# Patient Record
Sex: Female | Born: 1950 | Race: White | Hispanic: No | Marital: Married | Smoking: Former smoker
Health system: Southern US, Community
[De-identification: ages and names within clinical notes are randomized; demographics above are authoritative.]

## PROBLEM LIST (undated history)

## (undated) DIAGNOSIS — H409 Unspecified glaucoma: Secondary | ICD-10-CM

## (undated) HISTORY — PX: ORTHOPEDIC SURGERY: SHX850

---

## 2008-03-03 ENCOUNTER — Ambulatory Visit: Payer: Self-pay | Admitting: Family Medicine

## 2008-03-04 ENCOUNTER — Ambulatory Visit: Payer: Self-pay | Admitting: Family Medicine

## 2008-12-28 IMAGING — US US EXTREM LOW VENOUS*L*
1 series · 17 of 24 positions shown · non-contrast
Comparison: none

REASON FOR EXAM: Left leg pain and redness, evaluate for DVT
        Call report to: [phone]
COMMENTS:

[Series 1: us extrem low venous*left* · 17 of 25 slices shown]
[im 1/25]
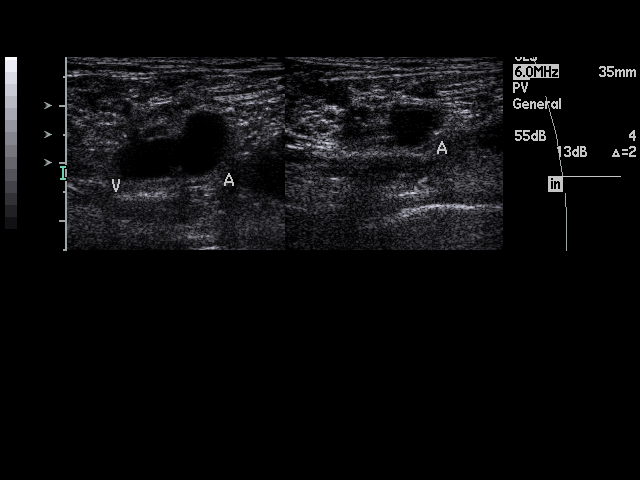
[im 3/25]
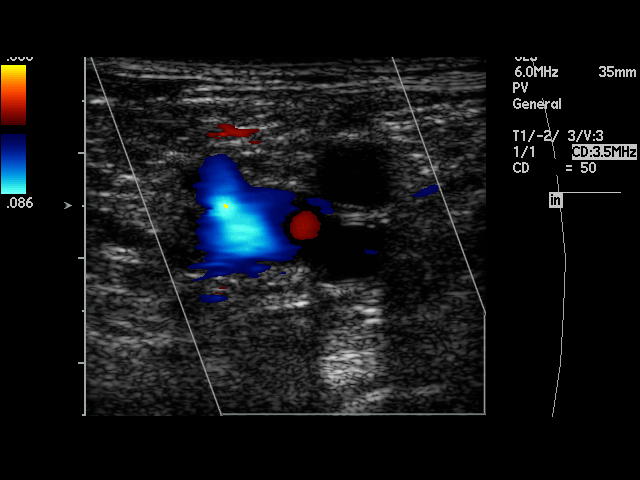
[im 4/25]
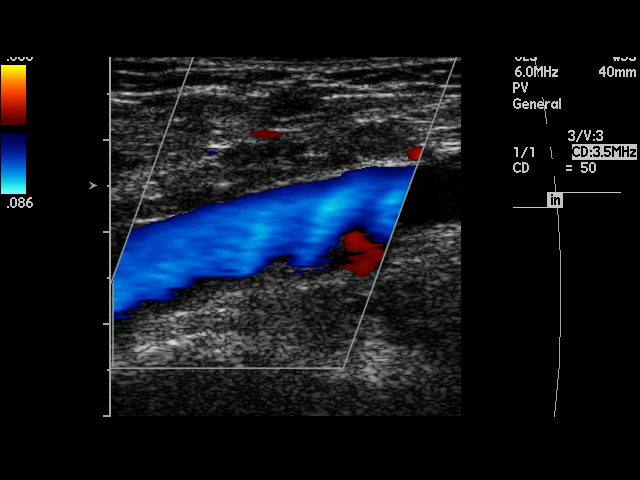
[im 5/25]
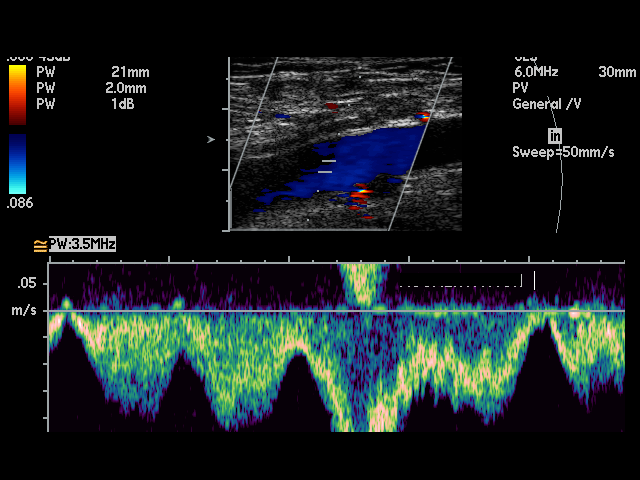
[im 7/25]
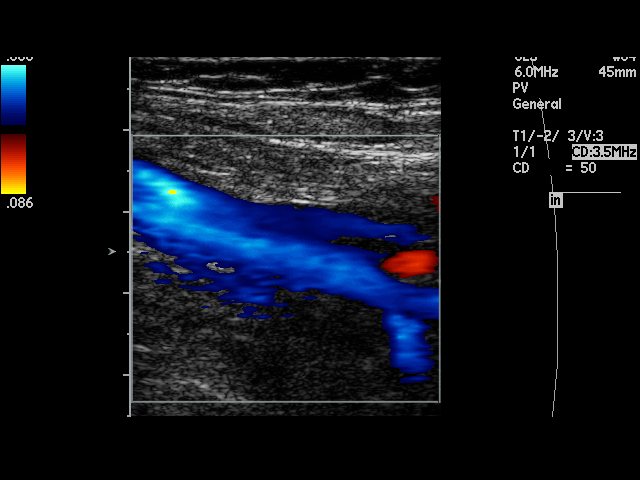
[im 8/25]
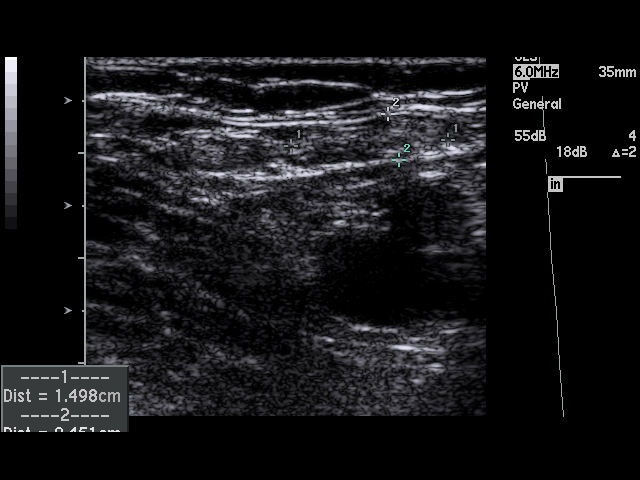
[im 10/25]
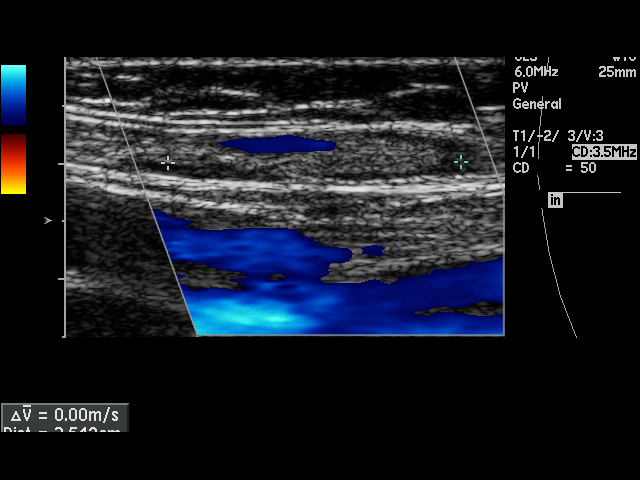
[im 11/25]
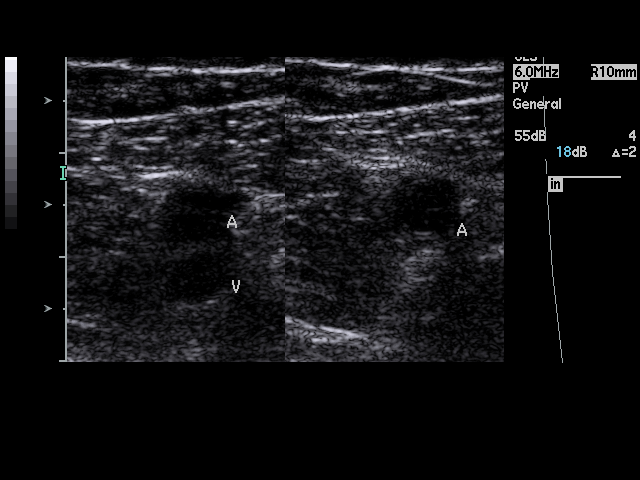
[im 13/25]
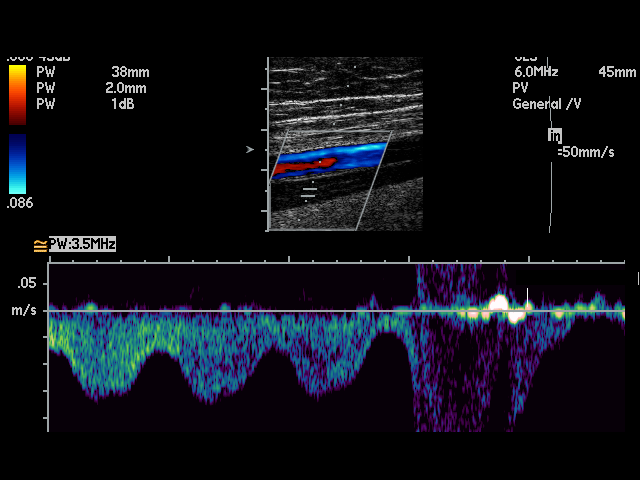
[im 14/25]
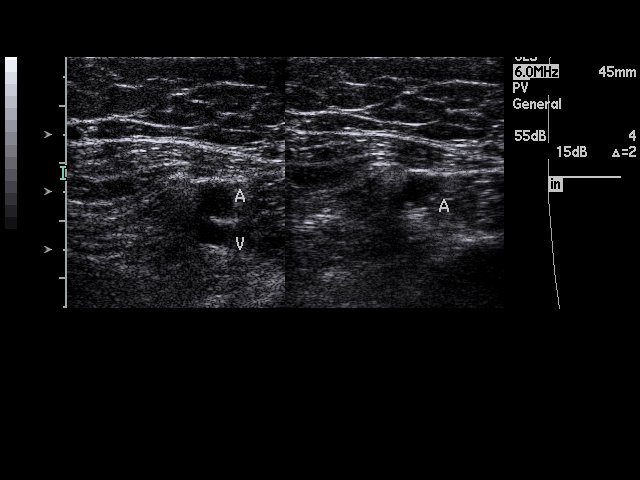
[im 15/25]
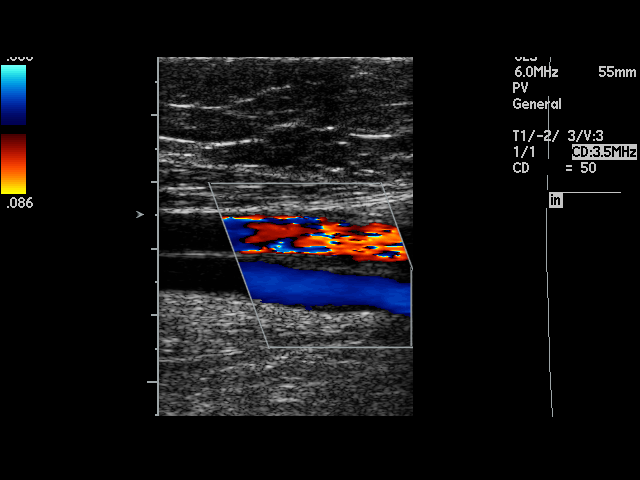
[im 17/25]
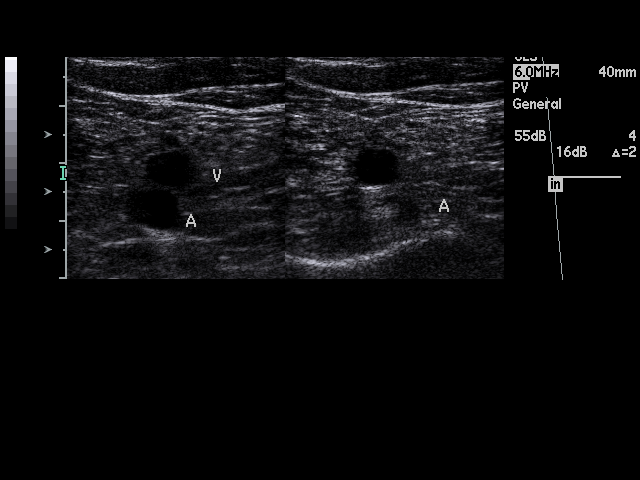
[im 18/25]
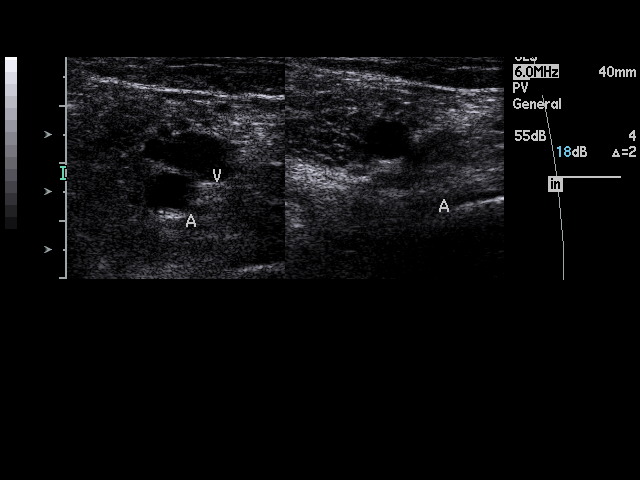
[im 20/25]
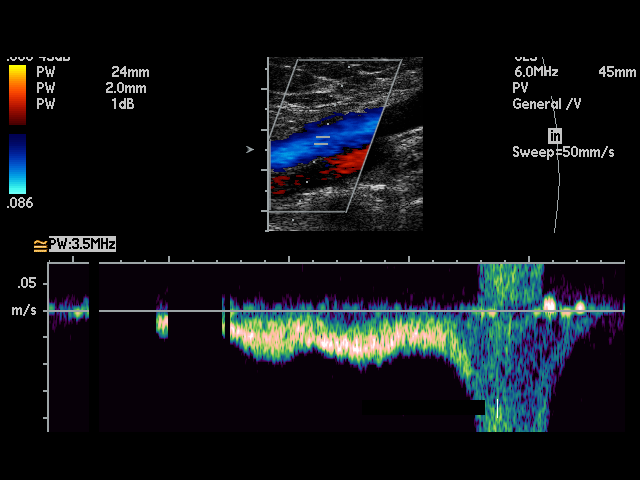
[im 21/25]
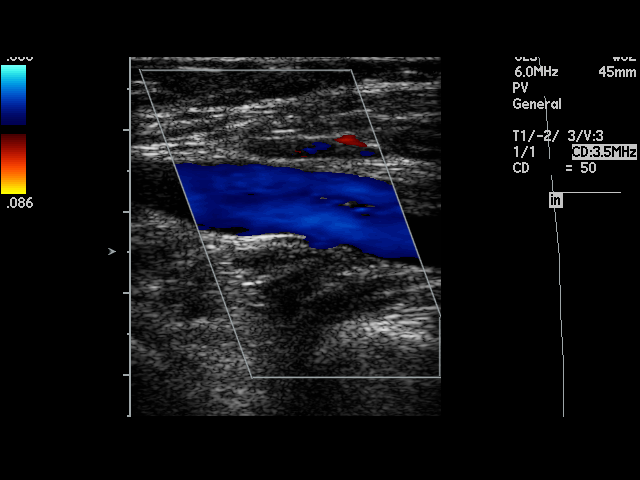
[im 22/25]
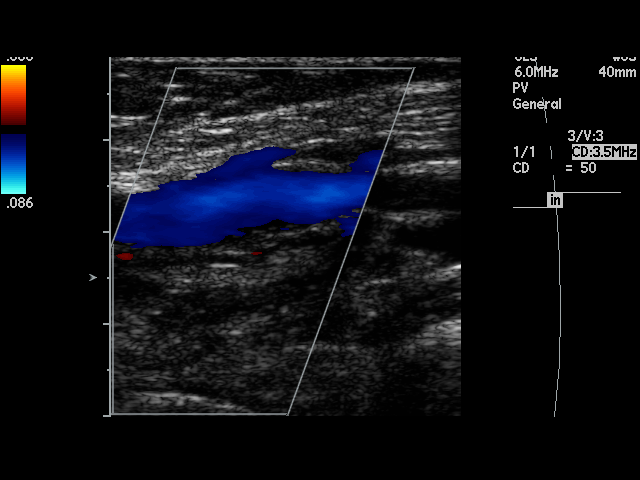
[im 25/25]
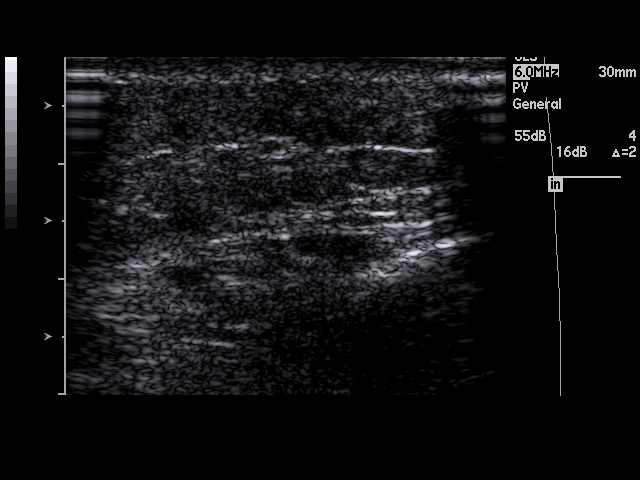

[17 of 24 positions shown; findings below may reference images not displayed]

PROCEDURE:     SKNGHWAN - SKNGHWAN DOPPLER LOW EXTR LEFT  - March 04, 2008  [DATE]

RESULT:     The augmentation and flow waveforms are normal. The LEFT femoral
and popliteal vein shows complete compressibility throughout its course.
Doppler examination shows no occlusion or evidence of deep vein thrombosis.
IMPRESSION: No deep vein thrombosis is identified in the LEFT leg.

## 2013-05-17 ENCOUNTER — Ambulatory Visit: Payer: Self-pay | Admitting: Family Medicine

## 2017-06-18 ENCOUNTER — Ambulatory Visit
Admission: EM | Admit: 2017-06-18 | Discharge: 2017-06-18 | Disposition: A | Discharge status: Home / Self Care | Payer: Medicare Other

## 2017-06-18 DIAGNOSIS — S60862A Insect bite (nonvenomous) of left wrist, initial encounter: Secondary | ICD-10-CM

## 2017-06-18 DIAGNOSIS — L03113 Cellulitis of right upper limb: Secondary | ICD-10-CM | POA: Diagnosis not present

## 2017-06-18 DIAGNOSIS — W57XXXA Bitten or stung by nonvenomous insect and other nonvenomous arthropods, initial encounter: Secondary | ICD-10-CM | POA: Diagnosis not present

## 2017-06-18 DIAGNOSIS — R030 Elevated blood-pressure reading, without diagnosis of hypertension: Secondary | ICD-10-CM | POA: Diagnosis not present

## 2017-06-18 HISTORY — DX: Unspecified glaucoma: H40.9

## 2017-06-18 MED ORDER — MUPIROCIN CALCIUM 2 % EX CREA
1.0000 | TOPICAL_CREAM | Freq: Two times a day (BID) | CUTANEOUS | 0 refills | Status: AC
Start: 2017-06-18 — End: 2017-06-25

## 2017-06-18 MED ORDER — DOXYCYCLINE HYCLATE 100 MG PO CAPS: 100.0000 mg | ORAL_CAPSULE | Freq: Two times a day (BID) | ORAL | 0 refills | Status: AC

## 2017-06-18 NOTE — ED Provider Notes (Signed)
CSN: [id number]     Arrival date & time 06/18/17  1417 History   None    Chief Complaint  Patient presents with  . Insect Bite   (Consider location/radiation/quality/duration/timing/severity/associated sxs/prior Treatment) Pt is right hand dominant, tetanus UTD   The history is provided by the patient. No language interpreter was used.    Past Medical History:  Diagnosis Date  . Glaucoma    Past Surgical History:  Procedure Laterality Date  . ORTHOPEDIC SURGERY     History reviewed. No pertinent family history. Social History  Substance Use Topics  . Smoking status: Former Smoker    Types: Cigarettes    Quit date: 06/18/2016  . Smokeless tobacco: Never Used  . Alcohol use No   OB History    No data available     Review of Systems  Constitutional: Negative for fever.  Skin: Positive for color change and wound.  All other systems reviewed and are negative.   Allergies  Patient has no known allergies.  Home Medications   Prior to Admission medications   Medication Sig Start Date End Date Taking? Authorizing Provider  timolol (BETIMOL) 0.25 % ophthalmic solution 1-2 drops 2 (two) times daily.   Yes [provider]  Travoprost, BAK Free, (TRAVATAN) 0.004 % SOLN ophthalmic solution 1 drop at bedtime.   Yes [provider]  doxycycline (VIBRAMYCIN) 100 MG capsule Take 1 capsule (100 mg total) by mouth 2 (two) times daily. 06/18/17   Amelita Risinger, Para MarchJeanette, NP  mupirocin cream (BACTROBAN) 2 % Apply 1 application topically 2 (two) times daily. 06/18/17 06/25/17  Kiearra Oyervides, Para MarchJeanette, NP   Meds Ordered and Administered this Visit  Medications - No data to display  BP (!) 143/88 (BP Location: Left Arm)   Pulse 78   Temp 98.6 F (37 C) (Oral)   Resp 16   Ht 5\' 3"  (1.6 m)   Wt 163 lb (73.9 kg)   SpO2 99%   BMI 28.87 kg/m  No data found.   Physical Exam  Constitutional: She is oriented to person, place, and time. She appears well-developed and  well-nourished. She is active and cooperative. No distress.  HENT:  Head: Normocephalic.  Eyes: Pupils are equal, round, and reactive to light.  Neck: Normal range of motion.  Cardiovascular: Normal rate, regular rhythm and normal pulses.   Pulses:      Radial pulses are 2+ on the right side, and 2+ on the left side.  Pulmonary/Chest: Effort normal and breath sounds normal.  Musculoskeletal: Normal range of motion. She exhibits no edema or tenderness.  Lymphadenopathy:    She has no cervical adenopathy.  Neurological: She is alert and oriented to person, place, and time. No cranial nerve deficit or sensory deficit. GCS eye subscore is 4. GCS verbal subscore is 5. GCS motor subscore is 6.  Skin: Skin is warm and dry. Rash noted. Rash is not vesicular and not urticarial. There is erythema.  Left inner wrist single area of weeping c/w envenomation of unknown insect. Pt erythema from left inner wrist to mid forearm.   Psychiatric: She has a normal mood and affect. Her speech is normal and behavior is normal.  Nursing note and vitals reviewed.   Urgent Care Course     Procedures (including critical care time)  Labs Review Labs Reviewed - No data to display  Imaging Review No results found.       MDM   1. Insect bite, initial encounter   2. Cellulitis of  right upper extremity   3. Elevated blood pressure reading    Rest,elevate, take meds. Watch for s/s of infection, if worsening redness, fever over 101, N,V, go to Er for further evaluation. Follow up with pcp for recheck of bp in 2-3 days.   Clancy Gourd, NP 06/18/17 1719

## 2017-06-18 NOTE — ED Triage Notes (Signed)
Pt reports being bitten or stung by an insect yesterday. Left forearm swelling, redness and burning. 1/10.

## 2017-06-18 NOTE — Discharge Instructions (Signed)
Rest,elevate, take meds. Watch for s/s of infection, if worsening redness, fever over 101, N,V, go to Er for further evaluation. Follow up with pcp for recheck of bp in 2-3 days.

## 2018-06-18 ENCOUNTER — Ambulatory Visit
Admission: EM | Admit: 2018-06-18 | Discharge: 2018-06-18 | Disposition: A | Discharge status: Home / Self Care | Payer: Medicare Other | Attending: Internal Medicine | Admitting: Internal Medicine

## 2018-06-18 DIAGNOSIS — S50862A Insect bite (nonvenomous) of left forearm, initial encounter: Secondary | ICD-10-CM

## 2018-06-18 DIAGNOSIS — W57XXXA Bitten or stung by nonvenomous insect and other nonvenomous arthropods, initial encounter: Secondary | ICD-10-CM

## 2018-06-18 MED ORDER — CEPHALEXIN 500 MG PO CAPS: 500.0000 mg | ORAL_CAPSULE | Freq: Two times a day (BID) | ORAL | 0 refills | Status: AC

## 2018-06-18 MED ORDER — PREDNISONE 50 MG PO TABS: 50.0000 mg | ORAL_TABLET | Freq: Every day | ORAL | 0 refills | Status: AC

## 2018-06-18 NOTE — Discharge Instructions (Addendum)
Sting or bite on forearm represents a severe local reaction rather than a true allergy.  Prescriptions for prednisone (steroid, to help with swelling/itching) and keflex (antiobiotic, in case of infection) were sent to the pharmacy.  Ice for 5-10 minutes several times daily will also help decrease swelling/itching/discomfort.  Anticipate gradual improvement over the next few days; may take a couple weeks for all swelling/itching to subside.

## 2018-06-18 NOTE — ED Triage Notes (Addendum)
Pt states she was bit yesterday on her left forearm, unsure what it was. Does not itch but it is red and swollen. Did apply hydrocortisone to it. States it was some sore of insect that got her. Does have streaking going up her arm and warm to the touch.

## 2018-06-19 NOTE — ED Provider Notes (Signed)
[facility]    CSN: [id number] Arrival date & time: 06/18/18  1754     History   Chief Complaint Chief Complaint  Patient presents with  . Insect Bite    HPI Rebecca Dorsey is a 67 y.o. female.   She was stung on the left forearm yesterday by type of insect, immediately had a red slightly swollen lesion around the bite site, a couple of inches across.  Today, the site has drastically expanded, with redness and induration over much of the ventral surface of her left forearm.  There is also a streak at the proximal aspect of this extending about 4 inches towards her elbow.  Itchy, does not really hurt.  No fever, no malaise.  Not coughing, no breathlessness, no GI upset.  Describes a similarly exuberant reaction to an insect sting last year.   HPI  Past Medical History:  Diagnosis Date  . Glaucoma    Past Surgical History:  Procedure Laterality Date  . ORTHOPEDIC SURGERY       Home Medications    Prior to Admission medications   Medication Sig Start Date End Date Taking? Authorizing Provider  brimonidine (ALPHAGAN) 0.2 % ophthalmic solution Administer 1 drop to the right eye Two (2) times a day. 04/19/18  Yes [provider]  timolol (BETIMOL) 0.25 % ophthalmic solution 1-2 drops 2 (two) times daily.   Yes [provider]  Travoprost, BAK Free, (TRAVATAN) 0.004 % SOLN ophthalmic solution 1 drop at bedtime.   Yes [provider]  cephALEXin (KEFLEX) 500 MG capsule Take 1 capsule (500 mg total) by mouth 2 (two) times daily for 5 days. 06/18/18 06/23/18  Isa RankinMurray, Murl Golladay Wilson, MD  doxycycline (VIBRAMYCIN) 100 MG capsule Take 1 capsule (100 mg total) by mouth 2 (two) times daily. 06/18/17   Defelice, Para MarchJeanette, NP  predniSONE (DELTASONE) 50 MG tablet Take 1 tablet (50 mg total) by mouth daily. 06/18/18   Isa RankinMurray, Anah Billard Wilson, MD    Social History Social History   Tobacco Use  . Smoking status: Former Smoker    Types: Cigarettes    Last  attempt to quit: 06/18/2016    Years since quitting: 2.0  . Smokeless tobacco: Never Used  Substance Use Topics  . Alcohol use: No  . Drug use: No     Allergies   Patient has no known allergies.   Review of Systems Review of Systems  All other systems reviewed and are negative.    Physical Exam Triage Vital Signs ED Triage Vitals  Enc Vitals Group     BP 06/18/18 1909 (!) 152/83     Pulse Rate 06/18/18 1908 63     Resp 06/18/18 1908 18     Temp 06/18/18 1908 99 F (37.2 C)     Temp Source 06/18/18 1908 Oral     SpO2 06/18/18 1908 100 %     Weight --      Height --      Pain Score 06/18/18 1907 2     Pain Loc --    Updated Vital Signs BP (!) 152/83   Pulse 63   Temp 99 F (37.2 C) (Oral)   Resp 18   LMP  (Exact Date)   SpO2 100%  Physical Exam  Constitutional: She is oriented to person, place, and time. No distress.  Alert, nicely groomed  HENT:  Head: Atraumatic.  Eyes:  Conjugate gaze, no eye redness/drainage  Neck: Neck supple.  Cardiovascular: Normal rate.  Pulmonary/Chest: No respiratory distress.  Lungs clear, symmetric breath sounds  Abdominal: She exhibits no distension.  Musculoskeletal: Normal range of motion.  No leg swelling  Neurological: She is alert and oriented to person, place, and time.  Skin: Skin is warm and dry.  No cyanosis  Nursing note and vitals reviewed.        UC Treatments / Results  Labs (all labs ordered are listed, but only abnormal results are displayed) Labs Reviewed - No data to display  EKG None  Radiology No results found.  Procedures Procedures (including critical care time)  Medications Ordered in UC Medications - No data to display  Final Clinical Impressions(s) / UC Diagnoses   Final diagnoses:  Insect bite of left forearm with local reaction, initial encounter  doubt infection but due to streaking proximally gave rx cephalexin to cover the possibility.   Discharge Instructions       Sting or bite on forearm represents a severe local reaction rather than a true allergy.  Prescriptions for prednisone (steroid, to help with swelling/itching) and keflex (antiobiotic, in case of infection) were sent to the pharmacy.  Ice for 5-10 minutes several times daily will also help decrease swelling/itching/discomfort.  Anticipate gradual improvement over the next few days; may take a couple weeks for all swelling/itching to subside.    ED Prescriptions    Medication Sig Dispense Auth. Provider   predniSONE (DELTASONE) 50 MG tablet Take 1 tablet (50 mg total) by mouth daily. 3 tablet Isa Rankin, MD   cephALEXin (KEFLEX) 500 MG capsule Take 1 capsule (500 mg total) by mouth 2 (two) times daily for 5 days. 10 capsule Isa Rankin, MD        Isa Rankin, MD 06/19/18 1200
